# Patient Record
Sex: Male | Born: 1990 | Race: Black or African American | Hispanic: No | Marital: Single | Smoking: Current some day smoker
Health system: Southern US, Community
[De-identification: ages and names within clinical notes are randomized; demographics above are authoritative.]

## PROBLEM LIST (undated history)

## (undated) DIAGNOSIS — M549 Dorsalgia, unspecified: Secondary | ICD-10-CM

## (undated) DIAGNOSIS — Z8619 Personal history of other infectious and parasitic diseases: Secondary | ICD-10-CM

## (undated) HISTORY — PX: OTHER SURGICAL HISTORY: SHX169

## (undated) HISTORY — DX: Dorsalgia, unspecified: M54.9

## (undated) HISTORY — DX: Personal history of other infectious and parasitic diseases: Z86.19

---
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
## 2009-04-25 ENCOUNTER — Emergency Department (HOSPITAL_COMMUNITY): Admission: EM | Admit: 2009-04-25 | Discharge: 2009-04-25 | Payer: Self-pay | Admitting: Family Medicine

## 2011-11-06 ENCOUNTER — Encounter: Payer: Self-pay | Admitting: Internal Medicine

## 2011-11-06 ENCOUNTER — Ambulatory Visit (INDEPENDENT_AMBULATORY_CARE_PROVIDER_SITE_OTHER): Payer: Self-pay | Admitting: Internal Medicine

## 2011-11-06 VITALS — BP 120/80 | HR 78 | Ht 68.0 in | Wt 138.0 lb

## 2011-11-06 DIAGNOSIS — M549 Dorsalgia, unspecified: Secondary | ICD-10-CM | POA: Insufficient documentation

## 2011-11-06 DIAGNOSIS — J302 Other seasonal allergic rhinitis: Secondary | ICD-10-CM | POA: Insufficient documentation

## 2011-11-06 DIAGNOSIS — J309 Allergic rhinitis, unspecified: Secondary | ICD-10-CM

## 2011-11-06 DIAGNOSIS — B360 Pityriasis versicolor: Secondary | ICD-10-CM

## 2011-11-06 NOTE — Patient Instructions (Signed)
Will review  Your records and give opinion about getting you some help for your back.   If you change your mind call and can come to get a flu shot.   Someone will contact you about   Consult referral .
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
## 2011-11-09 ENCOUNTER — Encounter: Payer: Self-pay | Admitting: Internal Medicine

## 2011-11-09 DIAGNOSIS — B360 Pityriasis versicolor: Secondary | ICD-10-CM | POA: Insufficient documentation

## 2011-11-10 ENCOUNTER — Encounter: Payer: Self-pay | Admitting: Speech Pathology

## 2011-11-14 ENCOUNTER — Encounter: Payer: Self-pay | Admitting: *Deleted

## 2011-12-04 ENCOUNTER — Ambulatory Visit (INDEPENDENT_AMBULATORY_CARE_PROVIDER_SITE_OTHER): Payer: 59 | Admitting: Sports Medicine

## 2011-12-04 VITALS — BP 130/80 | Ht 67.0 in | Wt 138.0 lb

## 2011-12-04 DIAGNOSIS — M549 Dorsalgia, unspecified: Secondary | ICD-10-CM

## 2011-12-04 MED ORDER — DIAZEPAM 5 MG PO TABS: 5.0000 mg | ORAL_TABLET | Freq: Every day | ORAL | Status: AC

## 2011-12-04 NOTE — Patient Instructions (Signed)
You have been scheduled for an appointment at [facility] [address] with Dr. Terrilee Files on 12/08/11 at 1:30 pm for back manipulation  Thank you for seeing [place] today!
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
## 2011-12-05 NOTE — Assessment & Plan Note (Signed)
This pain is secondary to muscle spasm.  The expectation for spasm is resolution within several weeks even without intervention.  Therefore, we are convinced that there is an underlying pathology causing him to have prolonged symptoms.  We have made him an appointment for OMT with the hope that it will relieve any pressure about the facets.  We have also given him valium for spasm relief in the interim.  His follow up will be determined at his OMT visit.

## 2011-12-08 ENCOUNTER — Ambulatory Visit: Payer: 59 | Admitting: Family Medicine

## 2013-05-26 ENCOUNTER — Ambulatory Visit (INDEPENDENT_AMBULATORY_CARE_PROVIDER_SITE_OTHER): Payer: 59 | Admitting: Sports Medicine

## 2013-05-26 VITALS — BP 127/75 | Ht 68.5 in | Wt 141.0 lb

## 2013-05-26 DIAGNOSIS — M549 Dorsalgia, unspecified: Secondary | ICD-10-CM

## 2013-05-26 NOTE — Progress Notes (Signed)
  Subjective:    Patient ID: Roger Yates, male    DOB: [birth year], 22 y.o.   MRN: [id number]  HPI chief complaint: "Followup on low back pain"  Very pleasant 22 year old male comes in today for followup on a low back injury that he sustained while in the [employer] in 2012. He suffered a lumbar strain at that time and was eventually discharged from the [employer]. He saw Dr. Laural Benes in our clinic back in 2013 prescribed him some Valium for muscle spasm. Patient tells me that since that time he has been symptom-free. 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 He denies any recurrent spasm. He denies any numbness or tingling. He is taking no medication currently for his low-back issues. Again, he states he has been symptom-free for several months. He is planning on joining the [employer] and needs clearance from his low-back injury before he is able to do so.  Otherwise healthy. Allergic to iodine     Review of Systems     Objective:   Physical Exam Well-developed, well-nourished. No acute distress. Awake alert and oriented x3. Vital signs are reviewed.  Lumbar spine: Full painless lumbar range of motion. No tenderness to palpation or percussion along the lumbar midline. No paraspinal musculature spasm. No tenderness over the SI joint. Negative straight leg raise bilaterally. Strength is 5/5 both lower extremities with reflexes brisk and equal at the Achilles and patellar tendons bilaterally. No atrophy. Sensation is intact to light touch. Patient is able to ambulate on his heels and toes without any difficulty.       Assessment & Plan:  1. Resolved low back pain secondary to lumbar strain  I see no reason why this patient can't join the [employer]. I will be happy to fill out any paperwork or write any type of letter that he needs for clearance. He has obviously been symptom-free for quite some time and I see no  worrisome findings on his physical exam today. Followup PRN.

## 2013-07-04 ENCOUNTER — Telehealth: Payer: Self-pay | Admitting: Internal Medicine
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
## 2013-07-04 NOTE — Telephone Encounter (Signed)
Called pt left message to give me a call back.

## 2015-02-15 ENCOUNTER — Emergency Department (HOSPITAL_COMMUNITY)
Admission: EM | Admit: 2015-02-15 | Discharge: 2015-02-15 | Disposition: A | Discharge status: Home / Self Care | Payer: 59 | Attending: Emergency Medicine | Admitting: Emergency Medicine

## 2015-02-15 ENCOUNTER — Emergency Department (HOSPITAL_COMMUNITY): Payer: 59

## 2015-02-15 ENCOUNTER — Encounter (HOSPITAL_COMMUNITY): Payer: Self-pay | Admitting: Emergency Medicine

## 2015-02-15 DIAGNOSIS — Z8739 Personal history of other diseases of the musculoskeletal system and connective tissue: Secondary | ICD-10-CM | POA: Diagnosis not present

## 2015-02-15 DIAGNOSIS — Z8619 Personal history of other infectious and parasitic diseases: Secondary | ICD-10-CM | POA: Diagnosis not present

## 2015-02-15 DIAGNOSIS — S199XXA Unspecified injury of neck, initial encounter: Secondary | ICD-10-CM | POA: Diagnosis not present

## 2015-02-15 DIAGNOSIS — Y9389 Activity, other specified: Secondary | ICD-10-CM | POA: Insufficient documentation

## 2015-02-15 DIAGNOSIS — Y92410 Unspecified street and highway as the place of occurrence of the external cause: Secondary | ICD-10-CM | POA: Insufficient documentation

## 2015-02-15 DIAGNOSIS — Y998 Other external cause status: Secondary | ICD-10-CM | POA: Diagnosis not present

## 2015-02-15 DIAGNOSIS — Z720 Tobacco use: Secondary | ICD-10-CM | POA: Diagnosis not present

## 2015-02-15 DIAGNOSIS — S4992XA Unspecified injury of left shoulder and upper arm, initial encounter: Secondary | ICD-10-CM | POA: Insufficient documentation

## 2015-02-15 MED ORDER — HYDROMORPHONE HCL 1 MG/ML IJ SOLN
0.5000 mg | Freq: Once | INTRAMUSCULAR | Status: AC
  Administered 2015-02-15: 0.5 mg via INTRAVENOUS
  Filled 2015-02-15: qty 1

## 2015-02-15 MED ORDER — IBUPROFEN 800 MG PO TABS: 800.0000 mg | ORAL_TABLET | Freq: Three times a day (TID) | ORAL | Status: AC

## 2015-02-15 MED ORDER — TRAMADOL HCL 50 MG PO TABS: 50.0000 mg | ORAL_TABLET | Freq: Four times a day (QID) | ORAL | Status: AC | PRN

## 2015-02-15 MED ORDER — KETOROLAC TROMETHAMINE 60 MG/2ML IM SOLN
30.0000 mg | Freq: Once | INTRAMUSCULAR | Status: AC
  Administered 2015-02-15: 30 mg via INTRAMUSCULAR
  Filled 2015-02-15: qty 2
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
## 2015-02-15 NOTE — ED Notes (Signed)
Patient transported to X-ray 
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
## 2015-02-15 NOTE — ED Notes (Signed)
Pt aware he cannot drive himself home after receiving narcotics in the ER.

## 2015-02-24 ENCOUNTER — Encounter: Payer: Self-pay | Admitting: Sports Medicine

## 2015-02-24 ENCOUNTER — Ambulatory Visit (INDEPENDENT_AMBULATORY_CARE_PROVIDER_SITE_OTHER): Payer: 59 | Admitting: Sports Medicine

## 2015-02-24 VITALS — BP 124/62 | Ht 68.0 in | Wt 127.0 lb

## 2015-02-24 DIAGNOSIS — M25512 Pain in left shoulder: Secondary | ICD-10-CM | POA: Diagnosis not present

## 2015-02-24 MED ORDER — IBUPROFEN 800 MG PO TABS
800.0000 mg | ORAL_TABLET | Freq: Three times a day (TID) | ORAL | Status: DC | PRN
Start: 2015-02-24 — End: 2017-03-06
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
## 2015-03-10 ENCOUNTER — Encounter: Payer: Self-pay | Admitting: Sports Medicine

## 2015-03-10 ENCOUNTER — Ambulatory Visit (INDEPENDENT_AMBULATORY_CARE_PROVIDER_SITE_OTHER): Payer: 59 | Admitting: Sports Medicine

## 2015-03-10 VITALS — BP 146/91 | Ht 68.0 in | Wt 127.0 lb

## 2015-03-10 DIAGNOSIS — M25512 Pain in left shoulder: Secondary | ICD-10-CM
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
## 2017-03-06 ENCOUNTER — Encounter (HOSPITAL_COMMUNITY): Payer: Self-pay

## 2017-03-06 ENCOUNTER — Emergency Department (HOSPITAL_COMMUNITY)
Admission: EM | Admit: 2017-03-06 | Discharge: 2017-03-06 | Disposition: A | Discharge status: Home / Self Care | Payer: 59 | Attending: Emergency Medicine | Admitting: Emergency Medicine

## 2017-03-06 DIAGNOSIS — X58XXXA Exposure to other specified factors, initial encounter: Secondary | ICD-10-CM | POA: Diagnosis not present

## 2017-03-06 DIAGNOSIS — Y999 Unspecified external cause status: Secondary | ICD-10-CM | POA: Diagnosis not present

## 2017-03-06 DIAGNOSIS — F17200 Nicotine dependence, unspecified, uncomplicated: Secondary | ICD-10-CM | POA: Diagnosis not present

## 2017-03-06 DIAGNOSIS — Z79899 Other long term (current) drug therapy: Secondary | ICD-10-CM | POA: Diagnosis not present

## 2017-03-06 DIAGNOSIS — Y939 Activity, unspecified: Secondary | ICD-10-CM | POA: Insufficient documentation

## 2017-03-06 DIAGNOSIS — S46812A Strain of other muscles, fascia and tendons at shoulder and upper arm level, left arm, initial encounter: Secondary | ICD-10-CM | POA: Insufficient documentation

## 2017-03-06 DIAGNOSIS — Y929 Unspecified place or not applicable: Secondary | ICD-10-CM | POA: Insufficient documentation

## 2017-03-06 DIAGNOSIS — S4992XA Unspecified injury of left shoulder and upper arm, initial encounter: Secondary | ICD-10-CM | POA: Diagnosis present

## 2017-03-06 DIAGNOSIS — S66812A Strain of other specified muscles, fascia and tendons at wrist and hand level, left hand, initial encounter: Secondary | ICD-10-CM | POA: Diagnosis not present

## 2017-03-06 MED ORDER — IBUPROFEN 600 MG PO TABS: 600.0000 mg | ORAL_TABLET | Freq: Four times a day (QID) | ORAL | 0 refills | Status: AC | PRN

## 2017-03-06 MED ORDER — METHOCARBAMOL 500 MG PO TABS
500.0000 mg | ORAL_TABLET | Freq: Once | ORAL | Status: AC
  Administered 2017-03-06: 500 mg via ORAL
  Filled 2017-03-06: qty 1

## 2017-03-06 MED ORDER — METHOCARBAMOL 500 MG PO TABS: 500.0000 mg | ORAL_TABLET | Freq: Every evening | ORAL | 0 refills | Status: AC | PRN

## 2017-03-06 MED ORDER — IBUPROFEN 200 MG PO TABS
600.0000 mg | ORAL_TABLET | Freq: Once | ORAL | Status: AC
  Administered 2017-03-06: 600 mg via ORAL
  Filled 2017-03-06: qty 3

## 2017-03-06 NOTE — ED Triage Notes (Signed)
Pt c/o left shoulder pain for past week, Doesn't feel like he can move it. Pt started having pain after receiving a massage. Denies any numbness or tingling of exremity.

## 2017-03-06 NOTE — Discharge Instructions (Signed)
Take Ibuprofen three times daily for the next week. Take this medicine with food. Take muscle relaxer at bedtime to help you sleep. This medicine makes you drowsy so do not take before driving or work Use a heating pad for sore muscles - use for 20 minutes several times a day Follow up with your doctor

## 2017-03-06 NOTE — ED Notes (Signed)
Bed: [facility] Expected date:  Expected time:  Means of arrival:  Comments: 26 yr old fall, rt flank pain

## 2017-03-06 NOTE — ED Provider Notes (Signed)
[facility]-EMERGENCY DEPT Provider Note   CSN: [id number] Arrival date & time: 03/06/17  2109     History   Chief Complaint Chief Complaint  Patient presents with  . Arm Pain    HPI Roger Yates is a 26 y.o. male who presents with back pain. He states that he had a professional massage a week ago when he first started to have upper back pain. It has been gradually worsening and it is worse with movement of his left arm. He is left hand dominant. He has not taken anything for pain but has been icing the area. He denies shoulder pain, numbness or tingling. He reports mild weakness due to worsening pain.  HPI  Past Medical History:  Diagnosis Date  . Back pain   . Hx of varicella     Patient Active Problem List   Diagnosis Date Noted  . Tinea versicolor 11/09/2011  . Back pain 11/06/2011  . Seasonal allergies 11/06/2011    Past Surgical History:  Procedure Laterality Date  . no past         Home Medications    Prior to Admission medications   Medication Sig Start Date End Date Taking? Authorizing Provider  diphenhydrAMINE (BENADRYL) 25 MG tablet Take 50 mg by mouth at bedtime as needed for allergies or sleep.    [provider]  ibuprofen (ADVIL,MOTRIN) 800 MG tablet Take 1 tablet (800 mg total) by mouth every 8 (eight) hours as needed. Take with food. 02/24/15   Ralene Corkraper, Timothy R, DO  loratadine (CLARITIN) 10 MG tablet Take 10 mg by mouth daily as needed for allergies (and sleep).    [provider]  traMADol (ULTRAM) 50 MG tablet Take 1 tablet (50 mg total) by mouth every 6 (six) hours as needed. 02/15/15   Gerhard MunchLockwood, Robert, MD    Family History Family History  Problem Relation Age of Onset  . Diabetes      grandparents both sides  . Hypertension    . Prostate cancer      GF    Social History Social History  Substance Use Topics  . Smoking status: Current Some Day Smoker  . Smokeless tobacco: Not on file  . Alcohol use No     Allergies     Iodine   Review of Systems Review of Systems  Musculoskeletal: Positive for back pain and myalgias. Negative for arthralgias and joint swelling.  Neurological: Negative for numbness.     Physical Exam Updated Vital Signs BP (!) 152/106 (BP Location: Right Arm)   Pulse 66   Temp 97.9 F (36.6 C) (Oral)   Resp 18   SpO2 100%   Physical Exam  Constitutional: He is oriented to person, place, and time. He appears well-developed and well-nourished. No distress.  HENT:  Head: Normocephalic and atraumatic.  Eyes: Conjunctivae are normal. Pupils are equal, round, and reactive to light. Right eye exhibits no discharge. Left eye exhibits no discharge. No scleral icterus.  Neck: Normal range of motion.  Cardiovascular: Normal rate.   Pulmonary/Chest: Effort normal. No respiratory distress.  Abdominal: He exhibits no distension.  Musculoskeletal:  Patient is point tender over left trapezius where it inserts by the upper thoracic area. No obvious swelling or deformity. Decreased ROM of L shoulder due to pain. 4/5 strength on left side compared to right. N/V intact.   Neurological: He is alert and oriented to person, place, and time.  Skin: Skin is warm and dry.  Psychiatric: He has a  normal mood and affect. His behavior is normal.  Nursing note and vitals reviewed.    ED Treatments / Results  Labs (all labs ordered are listed, but only abnormal results are displayed) Labs Reviewed - No data to display  EKG  EKG Interpretation None       Radiology No results found.  Procedures Procedures (including critical care time)  Medications Ordered in ED Medications - No data to display   Initial Impression / Assessment and Plan / ED Course  I have reviewed the triage vital signs and the nursing notes.  Pertinent labs & imaging results that were available during my care of the patient were reviewed by me and considered in my medical decision making (see chart for  details).  26 year old with muscle strain. Advised NSAIDs, muscle relaxer, heat. Follow up with PCP if symptoms do not improve. Return precautions given.  Final Clinical Impressions(s) / ED Diagnoses   Final diagnoses:  Trapezius strain, left, initial encounter    New Prescriptions New Prescriptions   No medications on file     Beryle Quant 03/07/17 Valda Favia, MD 03/08/17 [id number]

## 2017-07-20 ENCOUNTER — Encounter: Payer: Self-pay | Admitting: Internal Medicine

## 2020-01-13 ENCOUNTER — Ambulatory Visit: Payer: 59 | Admitting: Internal Medicine

## 2020-01-22 ENCOUNTER — Ambulatory Visit: Payer: 59 | Admitting: Internal Medicine
